# Patient Record
Sex: Female | Born: 1965 | Race: White | Hispanic: No | Marital: Married | Smoking: Never smoker
Health system: Southern US, Community
[De-identification: ages and names within clinical notes are randomized; demographics above are authoritative.]

## PROBLEM LIST (undated history)

## (undated) HISTORY — PX: FRACTURE SURGERY: SHX138

---

## 2004-09-23 ENCOUNTER — Ambulatory Visit: Payer: Self-pay | Admitting: Pediatrics

## 2016-10-23 DIAGNOSIS — M179 Osteoarthritis of knee, unspecified: Secondary | ICD-10-CM | POA: Insufficient documentation

## 2020-03-15 ENCOUNTER — Ambulatory Visit
Admission: EM | Admit: 2020-03-15 | Discharge: 2020-03-15 | Disposition: A | Discharge status: Home / Self Care | Payer: BC Managed Care – PPO | Attending: Family Medicine | Admitting: Family Medicine

## 2020-03-15 ENCOUNTER — Other Ambulatory Visit: Payer: Self-pay

## 2020-03-15 ENCOUNTER — Encounter: Payer: Self-pay | Admitting: Emergency Medicine

## 2020-03-15 DIAGNOSIS — R350 Frequency of micturition: Secondary | ICD-10-CM | POA: Diagnosis present

## 2020-03-15 DIAGNOSIS — R300 Dysuria: Secondary | ICD-10-CM | POA: Diagnosis present

## 2020-03-15 LAB — URINALYSIS, COMPLETE (UACMP) WITH MICROSCOPIC
Bilirubin Urine: NEGATIVE
Glucose, UA: NEGATIVE mg/dL
Ketones, ur: NEGATIVE mg/dL
Nitrite: NEGATIVE
Protein, ur: NEGATIVE mg/dL
Specific Gravity, Urine: >1.03 — ABNORMAL HIGH (ref 1.005–1.030)
pH: 5 (ref 5.0–8.0)

## 2020-03-15 MED ORDER — PHENAZOPYRIDINE HCL 200 MG PO TABS
200.0000 mg | ORAL_TABLET | Freq: Three times a day (TID) | ORAL | 0 refills | Status: DC
Start: 2020-03-15 — End: 2022-06-30

## 2020-03-15 MED ORDER — NITROFURANTOIN MONOHYD MACRO 100 MG PO CAPS
100.0000 mg | ORAL_CAPSULE | Freq: Two times a day (BID) | ORAL | 0 refills | Status: DC
Start: 2020-03-15 — End: 2022-06-30

## 2020-03-15 NOTE — ED Triage Notes (Signed)
Patient in today c/o urinary frequency and dysuria since yesterday. Patient denies fever. Patient has not used any OTC medications.

## 2020-03-15 NOTE — ED Provider Notes (Signed)
[facility]   CC: UTI  SUBJECTIVE:  Linda Lowery is a 54 y.o. female who complains of urinary frequency, urgency and dysuria for the 2 days.  Reports that she has not been drinking very much water.  Reports that she can go all day without urinating.  Reports that she has had a UTI in the past, but that it has been years and years.  Reports that there is lower abdominal pain.,  Denies flank pain.  Has not taken OTC medications for this.  Denies fever, chills, nausea, vomiting, abdominal pain, flank pain, abnormal vaginal discharge or bleeding, hematuria.    LMP: No LMP recorded. Patient is postmenopausal.  ROS: As in HPI.  All other pertinent ROS negative.     History reviewed. No pertinent past medical history. Past Surgical History:  Procedure Laterality Date  . CESAREAN SECTION    . FRACTURE SURGERY Left    knee   No Known Allergies No current facility-administered medications on file prior to encounter.   Current Outpatient Medications on File Prior to Encounter  Medication Sig Dispense Refill  . FYAVOLV 1-5 MG-MCG TABS tablet Take 1 tablet by mouth daily.    . meloxicam (MOBIC) 15 MG tablet prn     Social History   Socioeconomic History  . Marital status: Married    Spouse name: Not on file  . Number of children: Not on file  . Years of education: Not on file  . Highest education level: Not on file  Occupational History  . Not on file  Tobacco Use  . Smoking status: Never Smoker  . Smokeless tobacco: Never Used  Vaping Use  . Vaping Use: Never used  Substance and Sexual Activity  . Alcohol use: Yes    Comment: occassional  . Drug use: Never  . Sexual activity: Not on file  Other Topics Concern  . Not on file  Social History Narrative  . Not on file   Social Determinants of Health   Financial Resource Strain:   . Difficulty of Paying Living Expenses:   Food Insecurity:   . Worried About Programme researcher, broadcasting/film/video in the Last Year:   . Engineer, site in the Last Year:   Transportation Needs:   . Freight forwarder (Medical):   Marland Kitchen Lack of Transportation (Non-Medical):   Physical Activity:   . Days of Exercise per Week:   . Minutes of Exercise per Session:   Stress:   . Feeling of Stress :   Social Connections:   . Frequency of Communication with Friends and Family:   . Frequency of Social Gatherings with Friends and Family:   . Attends Religious Services:   . Active Member of Clubs or Organizations:   . Attends Banker Meetings:   Marland Kitchen Marital Status:   Intimate Partner Violence:   . Fear of Current or Ex-Partner:   . Emotionally Abused:   Marland Kitchen Physically Abused:   . Sexually Abused:    Family History  Problem Relation Age of Onset  . Other Mother        gun shot - homicide  . Dementia Father   . COPD Father     OBJECTIVE:  Vitals:   03/15/20 0835 03/15/20 0836  BP: 119/73   Pulse: 91   Resp: 18   Temp: 98.4 F (36.9 C)   TempSrc: Oral   SpO2: 98%   Weight:  153 lb (69.4 kg)  Height:  5\' 2"  (  1.575 m)   General appearance: AOx3 in no acute distress HEENT: NCAT.  Oropharynx clear.  Lungs: clear to auscultation bilaterally without adventitious breath sounds Heart: regular rate and rhythm.  Radial pulses 2+ symmetrical bilaterally Abdomen: soft; non-distended; suprapubic tenderness; bowel sounds present; no guarding or rebound tenderness Back: no CVA tenderness Extremities: no edema; symmetrical with no gross deformities Skin: warm and dry Neurologic: Ambulates from chair to exam table without difficulty Psychological: alert and cooperative; normal mood and affect  Labs Reviewed  URINALYSIS, COMPLETE (UACMP) WITH MICROSCOPIC - Abnormal; Notable for the following components:      Result Value   APPearance HAZY (*)    Specific Gravity, Urine >1.030 (*)    Hgb urine dipstick MODERATE (*)    Leukocytes,Ua SMALL (*)    Bacteria, UA FEW (*)    All other components within normal limits  URINE  CULTURE    ASSESSMENT & PLAN:  1. Dysuria   2. Urinary frequency     Meds ordered this encounter  Medications  . nitrofurantoin, macrocrystal-monohydrate, (MACROBID) 100 MG capsule    Sig: Take 1 capsule (100 mg total) by mouth 2 (two) times daily.    Dispense:  10 capsule    Refill:  0    Order Specific Question:   Supervising Provider    Answer:   Merrilee Jansky [id number]  . phenazopyridine (PYRIDIUM) 200 MG tablet    Sig: Take 1 tablet (200 mg total) by mouth 3 (three) times daily.    Dispense:  6 tablet    Refill:  0    Order Specific Question:   Supervising Provider    Answer:   Merrilee Jansky [id number]    Urine culture sent.  We will call you with abnormal results that need further treatment.   Push fluids and get plenty of rest.   Take antibiotic as directed and to completion Take pyridium as prescribed and as needed for symptomatic relief Follow up with PCP if symptoms persists Return here or go to ER if you have any new or worsening symptoms such as fever, worsening abdominal pain, nausea/vomiting, flank pain  Outlined signs and symptoms indicating need for more acute intervention. Patient verbalized understanding. After Visit Summary given.      Moshe Cipro, NP 03/15/20 [phone]

## 2020-03-15 NOTE — Discharge Instructions (Addendum)
You may have a urinary tract infection. We are going to culture your urine and will call you as soon as we have the results.   I have sent Macrobid to your pharmacy for you to take twice a day for 5 days  Drink plenty of water, 8-10 glasses per day.   You may take the pyridium for painful urination.  Follow up with your primary care provider as needed.   Go to the Emergency Department if you experience severe pain, shortness of breath, high fever, or other concerns.

## 2020-03-17 LAB — URINE CULTURE: Culture: 40000 — AB

## 2021-01-21 ENCOUNTER — Ambulatory Visit
Admission: EM | Admit: 2021-01-21 | Discharge: 2021-01-21 | Disposition: A | Discharge status: Home / Self Care | Payer: BC Managed Care – PPO

## 2021-01-21 ENCOUNTER — Ambulatory Visit: Payer: Self-pay

## 2021-01-21 ENCOUNTER — Other Ambulatory Visit: Payer: Self-pay

## 2021-01-21 ENCOUNTER — Ambulatory Visit (INDEPENDENT_AMBULATORY_CARE_PROVIDER_SITE_OTHER): Payer: BC Managed Care – PPO

## 2021-01-21 DIAGNOSIS — W19XXXA Unspecified fall, initial encounter: Secondary | ICD-10-CM

## 2021-01-21 DIAGNOSIS — R0781 Pleurodynia: Secondary | ICD-10-CM

## 2021-01-21 DIAGNOSIS — M546 Pain in thoracic spine: Secondary | ICD-10-CM

## 2021-01-21 MED ORDER — KETOROLAC TROMETHAMINE 10 MG PO TABS: 10.0000 mg | ORAL_TABLET | Freq: Four times a day (QID) | ORAL | 0 refills | Status: AC | PRN

## 2021-01-21 NOTE — ED Triage Notes (Signed)
Pt fell yesterday off a ladder and is c/o back pain on the right side through rib cage area.

## 2021-01-21 NOTE — ED Provider Notes (Signed)
[facility]    CSN: [id number] Arrival date & time: 01/21/21  6962      History   Chief Complaint Chief Complaint  Patient presents with  . Back Pain    HPI Linda Lowery is a 55 y.o. female presenting for right mid to lower back pain following a fall from a ladder yesterday.  Patient states that she fell about 2 to 3 feet to the ground.  She landed on her right side.  She has some pain in her right ribs.  No increased pain with breathing but does have increased pain with twisting and moving.  Has taken Aleve for the pain which does seem to help a little.  Also wearing a sports bra.  Patient states that a little bit of compression helps to.  Has tried heat as well.  Patient denies any other injuries.  She says she did not hit her head or lose consciousness.  She is denying any radiation of the back pain to her lower extremities and has not had any numbness, weakness or tingling.  Denies any shortness of breath or pain in her chest.  Patient says the fall was accidental and denies any inciting symptoms.  No other concerns.  HPI  History reviewed. No pertinent past medical history.  There are no problems to display for this patient.   Past Surgical History:  Procedure Laterality Date  . CESAREAN SECTION    . FRACTURE SURGERY Left    knee    OB History   No obstetric history on file.      Home Medications    Prior to Admission medications   Medication Sig Start Date End Date Taking? Authorizing Provider  estradiol (ESTRACE) 1 MG tablet Take 1 mg by mouth daily. 11/20/20  Yes [provider]  FYAVOLV 1-5 MG-MCG TABS tablet Take 1 tablet by mouth daily. 12/18/19  Yes [provider]  ketorolac (TORADOL) 10 MG tablet Take 1 tablet (10 mg total) by mouth every 6 (six) hours as needed for up to 5 days. 01/21/21 01/26/21 Yes Shirlee Latch, PA-C  norethindrone (MICRONOR) 0.35 MG tablet Take 1 tablet by mouth daily. 01/14/21  Yes [provider]   nitrofurantoin, macrocrystal-monohydrate, (MACROBID) 100 MG capsule Take 1 capsule (100 mg total) by mouth 2 (two) times daily. 03/15/20   Moshe Cipro, NP  phenazopyridine (PYRIDIUM) 200 MG tablet Take 1 tablet (200 mg total) by mouth 3 (three) times daily. 03/15/20   Moshe Cipro, NP    Family History Family History  Problem Relation Age of Onset  . Other Mother        gun shot - homicide  . Dementia Father   . COPD Father     Social History Social History   Tobacco Use  . Smoking status: Never Smoker  . Smokeless tobacco: Never Used  Vaping Use  . Vaping Use: Never used  Substance Use Topics  . Alcohol use: Yes    Comment: occassional  . Drug use: Never     Allergies   Patient has no known allergies.   Review of Systems Review of Systems  Respiratory: Negative for shortness of breath.   Cardiovascular: Negative for chest pain.  Gastrointestinal: Negative for abdominal pain.  Musculoskeletal: Positive for back pain. Negative for gait problem and joint swelling.  Skin: Negative for color change.  Neurological: Negative for dizziness, syncope, weakness, numbness and headaches.     Physical Exam Triage Vital Signs ED Triage Vitals  Enc Vitals Group     BP 01/21/21 0954 (!) 112/55     Pulse Rate 01/21/21 0954 71     Resp 01/21/21 0954 18     Temp 01/21/21 0954 98.4 F (36.9 C)     Temp Source 01/21/21 0954 Oral     SpO2 01/21/21 0954 99 %     Weight 01/21/21 0954 142 lb (64.4 kg)     Height 01/21/21 0954 5\' 2"  (1.575 m)     Head Circumference --      Peak Flow --      Pain Score 01/21/21 0953 9     Pain Loc --      Pain Edu? --      Excl. in GC? --    No data found.  Updated Vital Signs BP (!) 112/55 (BP Location: Left Arm)   Pulse 71   Temp 98.4 F (36.9 C) (Oral)   Resp 18   Ht 5\' 2"  (1.575 m)   Wt 142 lb (64.4 kg)   SpO2 99%   BMI 25.97 kg/m       Physical Exam Vitals and nursing note reviewed.  Constitutional:       General: She is not in acute distress.    Appearance: Normal appearance. She is not ill-appearing or toxic-appearing.  HENT:     Head: Normocephalic and atraumatic.  Eyes:     General: No scleral icterus.       Right eye: No discharge.        Left eye: No discharge.     Conjunctiva/sclera: Conjunctivae normal.     Pupils: Pupils are equal, round, and reactive to light.  Cardiovascular:     Rate and Rhythm: Normal rate and regular rhythm.     Heart sounds: Normal heart sounds.  Pulmonary:     Effort: Pulmonary effort is normal. No respiratory distress.     Breath sounds: Normal breath sounds.  Musculoskeletal:     Cervical back: Neck supple.     Thoracic back: Tenderness (TTP along right posterior lower ribs) present. No swelling.     Lumbar back: Tenderness (mild TTP right paralumbar muscles) present. No bony tenderness. Decreased range of motion.  Skin:    General: Skin is dry.  Neurological:     General: No focal deficit present.     Mental Status: She is alert and oriented to person, place, and time. Mental status is at baseline.     Cranial Nerves: No cranial nerve deficit.     Motor: No weakness.     Gait: Gait normal.  Psychiatric:        Mood and Affect: Mood normal.        Behavior: Behavior normal.        Thought Content: Thought content normal.      UC Treatments / Results  Labs (all labs ordered are listed, but only abnormal results are displayed) Labs Reviewed - No data to display  EKG   Radiology DG Ribs Unilateral W/Chest Right  Result Date: 01/21/2021 CLINICAL DATA:  Fall with right rib pain. EXAM: RIGHT RIBS AND CHEST - 3+ VIEW COMPARISON:  None. FINDINGS: No fracture or other bone lesions are seen involving the ribs. Mild degenerative changes are seen in both acromioclavicular joints. There is no evidence of pneumothorax or pleural effusion. Both lungs are clear. Heart size and mediastinal contours are within normal limits. IMPRESSION: No acute osseous  injury.  Clear lungs. Electronically Signed   By:  Litton M.D.   On: 01/21/2021 11:06    Procedures Procedures (including critical care time)  Medications Ordered in UC Medications - No data to display  Initial Impression / Assessment and Plan / UC Course  I have reviewed the triage vital signs and the nursing notes.  Pertinent labs & imaging results that were available during my care of the patient were reviewed by me and considered in my medical decision making (see chart for details).   55 year old female presenting for right sided rib and lower back pain following an accidental fall.  X-ray of right ribs obtained today to assess for possible rib fracture.  X-rays independently reviewed by me.  No fracture seen.  Overread by radiologist shows no acute abnormality/osseous injury.  Reviewed result with patient.  Advised this could be rib contusion and soft tissue/musculoskeletal pain.  Advised supportive care at this time.  Offered Toradol injection but patient declined stating she does not like needles.  I have sent this medication to the pharmacy.  Advised to take this or another NSAID but not both.  Advised also Tylenol and ice/heat.  Advised to follow-up with PCP if not improving in the next 7 to 10 days and ED precautions for back pain reviewed.   Final Clinical Impressions(s) / UC Diagnoses   Final diagnoses:  Acute right-sided thoracic back pain  Fall, initial encounter     Discharge Instructions     BACK PAIN: Normal x-ray. Stressed avoiding painful activities . Reviewed RICE guidelines. Use medications as directed, including NSAIDs. If no NSAIDs have been prescribed for you today, you may take Aleve or Motrin over the counter. May use Tylenol in between doses of NSAIDs.  If no improvement in the next 1-2 weeks, f/u with PCP or return to our office for reexamination, and please feel free to call or return at any time for any questions or concerns you may have and we will  be happy to help you!        ED Prescriptions    Medication Sig Dispense Auth. Provider   ketorolac (TORADOL) 10 MG tablet Take 1 tablet (10 mg total) by mouth every 6 (six) hours as needed for up to 5 days. 20 tablet Gareth Morgan     PDMP not reviewed this encounter.   Shirlee Latch, PA-C 01/21/21 1136

## 2021-01-21 NOTE — Discharge Instructions (Signed)
BACK PAIN: Normal x-ray. Stressed avoiding painful activities . Reviewed RICE guidelines. Use medications as directed, including NSAIDs. If no NSAIDs have been prescribed for you today, you may take Aleve or Motrin over the counter. May use Tylenol in between doses of NSAIDs.  If no improvement in the next 1-2 weeks, f/u with PCP or return to our office for reexamination, and please feel free to call or return at any time for any questions or concerns you may have and we will be happy to help you!

## 2022-06-29 ENCOUNTER — Ambulatory Visit: Payer: BC Managed Care – PPO | Admitting: Internal Medicine

## 2022-06-29 NOTE — Progress Notes (Signed)
Sleep Medicine   Office Visit  Patient Name: Linda Lowery DOB: [birth year] MRN [id number]    Chief Complaint: Sleep consult  Brief History:  Linda Lowery presents for an initial consult for sleep evaluation and to establish care. In 2020 she had a screener HST at her dentist office and was positive for OSA. No treatment has been started for her. She reports her sleep quality is fair due to feeling tired when she wakes in the mornings. This is noted most nights. The patient's bed partner reports  loud snoring and witnessed apnea at night. The patient relates the following symptoms: snoring and gasps that wake her, waking tired, brain fog, lack of focus, excessively sleepy with sleepy driving are also present. Low energy is also present. The patient goes to sleep at 11:00pm and wakes up at 6:00am and takes an hour to get out of bed. Sleep quality is the same when outside home environment.  Patient has noted no restlessness of her legs at night that would disrupt her sleep.  The patient  relates no unusual behavior during the night.  The patient relates no history of psychiatric problems. The Epworth Sleepiness Score is 12 out of 24 .  The patient relates  Cardiovascular risk factors include: none. The patient does note she may want to follow up with her dentist for oral appliance rather than cpap if an option based on her study, but is open to options.   ROS  General: (-) fever, (-) chills, (-) night sweat Nose and Sinuses: (-) nasal stuffiness or itchiness, (-) postnasal drip, (-) nosebleeds, (-) sinus trouble. Mouth and Throat: (-) sore throat, (-) hoarseness. Neck: (-) swollen glands, (-) enlarged thyroid, (-) neck pain. Respiratory: - cough, - shortness of breath, - wheezing. Neurologic: - numbness, - tingling. Psychiatric: - anxiety, - depression Sleep behavior: -sleep paralysis -hypnogogic hallucinations -dream enactment      -vivid dreams -cataplexy -night terrors -sleep walking   Current  Medication: Outpatient Encounter Medications as of 06/30/2022  Medication Sig   estradiol (VIVELLE-DOT) 0.0375 MG/24HR Place onto the skin.   progesterone (PROMETRIUM) 100 MG capsule Take 100 mg by mouth at bedtime.   [DISCONTINUED] estradiol (ESTRACE) 1 MG tablet Take 1 mg by mouth daily.   [DISCONTINUED] FYAVOLV 1-5 MG-MCG TABS tablet Take 1 tablet by mouth daily.   [DISCONTINUED] nitrofurantoin, macrocrystal-monohydrate, (MACROBID) 100 MG capsule Take 1 capsule (100 mg total) by mouth 2 (two) times daily.   [DISCONTINUED] norethindrone (MICRONOR) 0.35 MG tablet Take 1 tablet by mouth daily.   [DISCONTINUED] phenazopyridine (PYRIDIUM) 200 MG tablet Take 1 tablet (200 mg total) by mouth 3 (three) times daily.   No facility-administered encounter medications on file as of 06/30/2022.    Surgical History: Past Surgical History:  Procedure Laterality Date   CESAREAN SECTION     FRACTURE SURGERY Left    knee    Medical History: History reviewed. No pertinent past medical history.  Family History: Non contributory to the present illness  Social History: Social History   Socioeconomic History   Marital status: Married    Spouse name: Not on file   Number of children: Not on file   Years of education: Not on file   Highest education level: Not on file  Occupational History   Not on file  Tobacco Use   Smoking status: Never   Smokeless tobacco: Never  Vaping Use   Vaping Use: Never used  Substance and Sexual Activity   Alcohol use: Yes  Comment: occassional   Drug use: Never   Sexual activity: Not on file  Other Topics Concern   Not on file  Social History Narrative   Not on file   Social Determinants of Health   Financial Resource Strain: Not on file  Food Insecurity: Not on file  Transportation Needs: Not on file  Physical Activity: Not on file  Stress: Not on file  Social Connections: Not on file  Intimate Partner Violence: Not on file    Vital  Signs: Blood pressure 109/75, pulse 68, resp. rate 12, height 5\' 2"  (1.575 m), weight 146 lb 6.4 oz (66.4 kg), SpO2 97 %. Body mass index is 26.78 kg/m.   Examination: General Appearance: The patient is well-developed, well-nourished, and in no distress. Neck Circumference: 34cm Skin: Gross inspection of skin unremarkable. Head: normocephalic, no gross deformities. Eyes: no gross deformities noted. ENT: ears appear grossly normal Neurologic: Alert and oriented. No involuntary movements.    STOP BANG RISK ASSESSMENT S (snore) Have you been told that you snore?     YES   T (tired) Are you often tired, fatigued, or sleepy during the day?   YES  O (obstruction) Do you stop breathing, choke, or gasp during sleep? YES   P (pressure) Do you have or are you being treated for high blood pressure? NO   B (BMI) Is your body index greater than 35 kg/m? NO   A (age) Are you 67 years old or older? YES   N (neck) Do you have a neck circumference greater than 16 inches?   NO   G (gender) Are you a female? NO   TOTAL STOP/BANG "YES" ANSWERS 4                                                               A STOP-Bang score of 2 or less is considered low risk, and a score of 5 or more is high risk for having either moderate or severe OSA. For people who score 3 or 4, doctors may need to perform further assessment to determine how likely they are to have OSA.         EPWORTH SLEEPINESS SCALE:  Scale:  (0)= no chance of dozing; (1)= slight chance of dozing; (2)= moderate chance of dozing; (3)= high chance of dozing  Chance  Situtation    Sitting and reading: 1    Watching TV: 3    Sitting Inactive in public: 1    As a passenger in car: 3      Lying down to rest: 2    Sitting and talking: 0    Sitting quielty after lunch: 0    In a car, stopped in traffic: 2   TOTAL SCORE:   12 out of 24    SLEEP STUDIES:  Not available   LABS: No results found for this or any  previous visit (from the past 2160 hour(s)).  Radiology: DG Ribs Unilateral W/Chest Right  Result Date: 01/21/2021 CLINICAL DATA:  Fall with right rib pain. EXAM: RIGHT RIBS AND CHEST - 3+ VIEW COMPARISON:  None. FINDINGS: No fracture or other bone lesions are seen involving the ribs. Mild degenerative changes are seen in both acromioclavicular joints. There is no evidence of pneumothorax or pleural effusion.  Both lungs are clear. Heart size and mediastinal contours are within normal limits. IMPRESSION: No acute osseous injury.  Clear lungs. Electronically Signed   By: Zerita Boers M.D.   On: 01/21/2021 11:06    No results found.  No results found.    Assessment and Plan: There are no problems to display for this patient.    PLAN OSA:   Patient evaluation suggests high risk of sleep disordered breathing due to positive Osa screening HST with dentist, snoring and gasps that wake her, witnessed apneas, waking tired, brain fog, lack of focus, and excessively sleepy with sleepy driving.   Suggest: PSG to assess/treat the patient's sleep disordered breathing. The patient was also counselled on wt loss to optimize sleep health.    1. Hypersomnia Will order PSG    General Counseling: I have discussed the findings of the evaluation and examination with Verdis Frederickson.  I have also discussed any further diagnostic evaluation thatmay be needed or ordered today. [place] verbalizes understanding of the findings of todays visit. We also reviewed her medications today and discussed drug interactions and side effects including but not limited excessive drowsiness and altered mental states. We also discussed that there is always a risk not just to her but also people around her. she has been encouraged to call the office with any questions or concerns that should arise related to todays visit.  No orders of the defined types were placed in this encounter.       I have personally obtained a history,  evaluated the patient, evaluated pertinent data, formulated the assessment and plan and placed orders.  This patient was seen by Drema Dallas, PA-C in collaboration with Dr. Devona Konig as a part of collaborative care agreement.   Allyne Gee, MD [facility] Diplomate ABMS Pulmonary and Critical Care Medicine Sleep medicine

## 2022-06-29 NOTE — Progress Notes (Signed)
Pt presented 17 minutes late for her appointment and had to be rescheduled.

## 2022-06-30 ENCOUNTER — Ambulatory Visit (INDEPENDENT_AMBULATORY_CARE_PROVIDER_SITE_OTHER): Payer: BC Managed Care – PPO | Admitting: Internal Medicine

## 2022-06-30 VITALS — BP 109/75 | HR 68 | Resp 12 | Ht 62.0 in | Wt 146.4 lb

## 2022-06-30 DIAGNOSIS — G4710 Hypersomnia, unspecified: Secondary | ICD-10-CM | POA: Diagnosis not present

## 2022-08-02 IMAGING — CR DG RIBS W/ CHEST 3+V*R*
5 series · 5 of 5 positions shown · non-contrast
Comparison: None.

CLINICAL DATA: Fall with right rib pain.

EXAM:
RIGHT RIBS AND CHEST - 3+ VIEW

[chest pa]
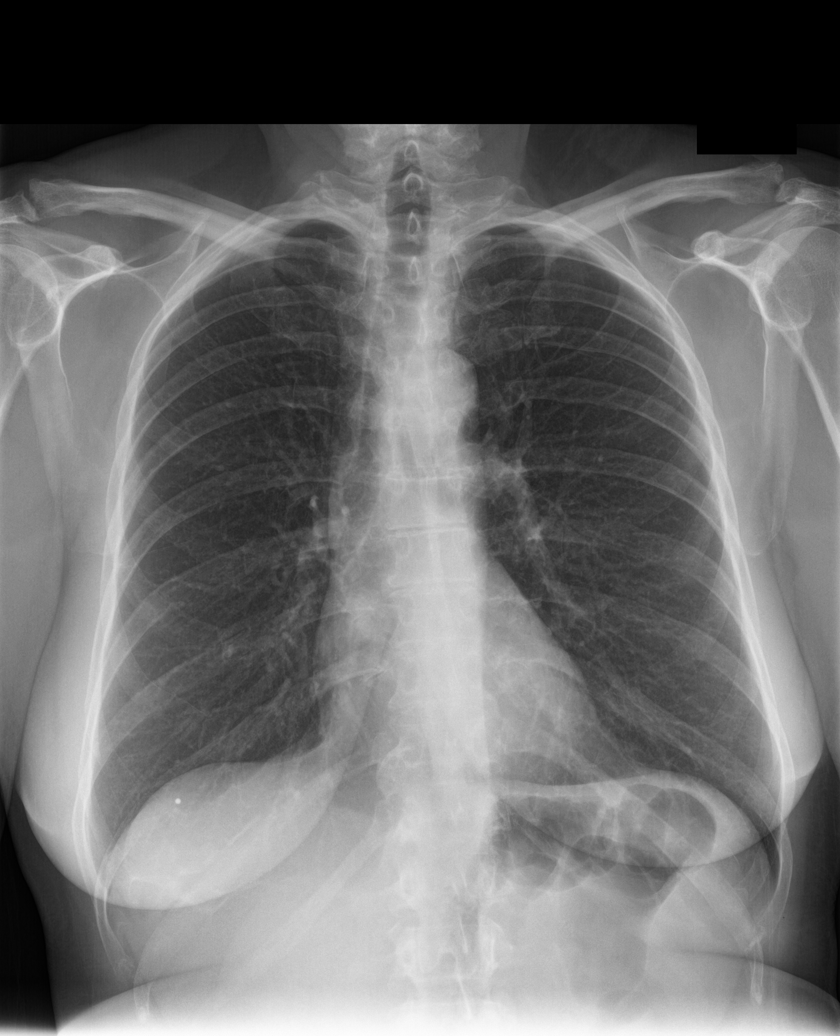

[rib pa (1 of 2)]
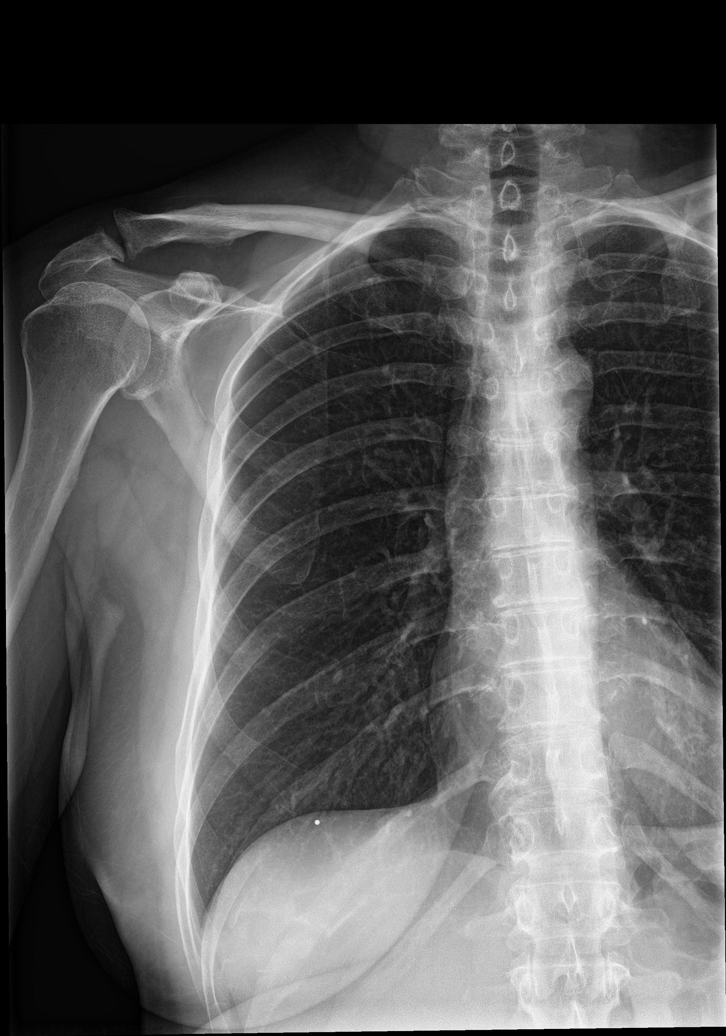

[rib pa (2 of 2)]
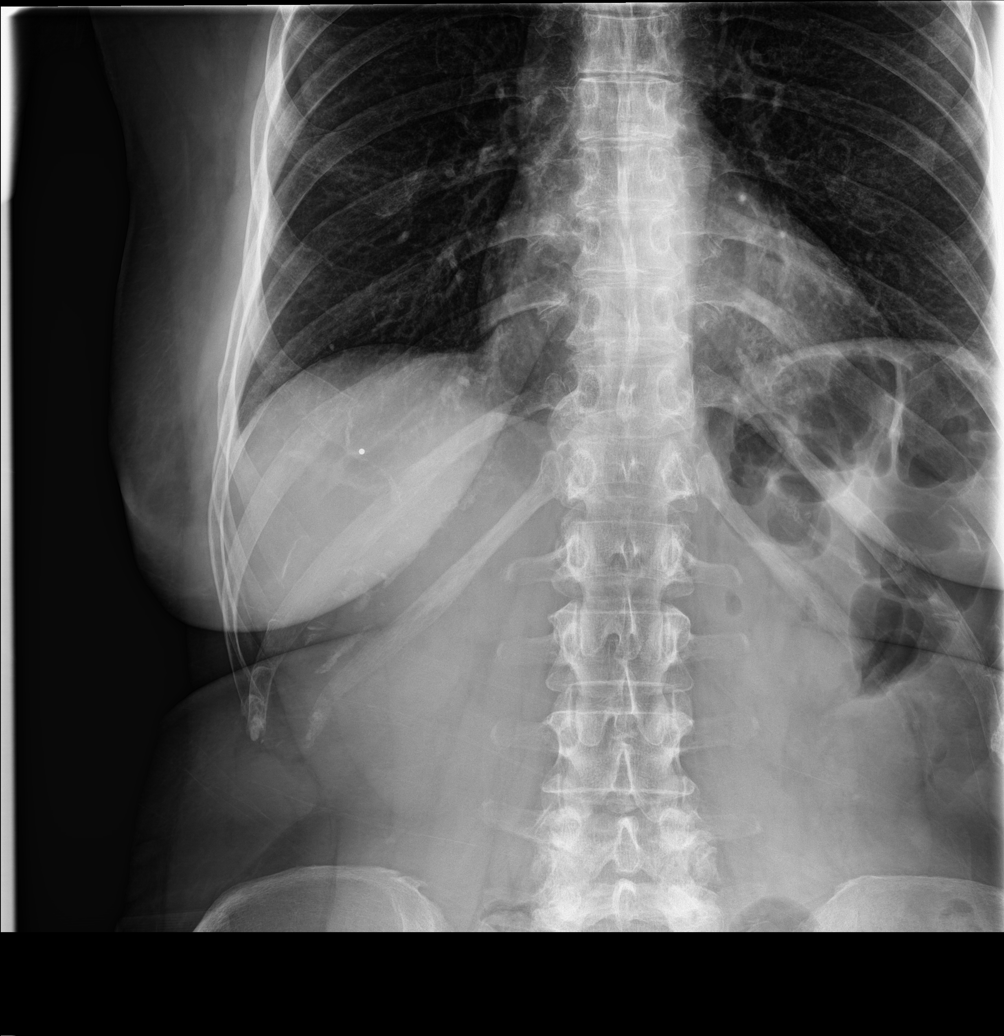

[rib obl (1 of 2)]
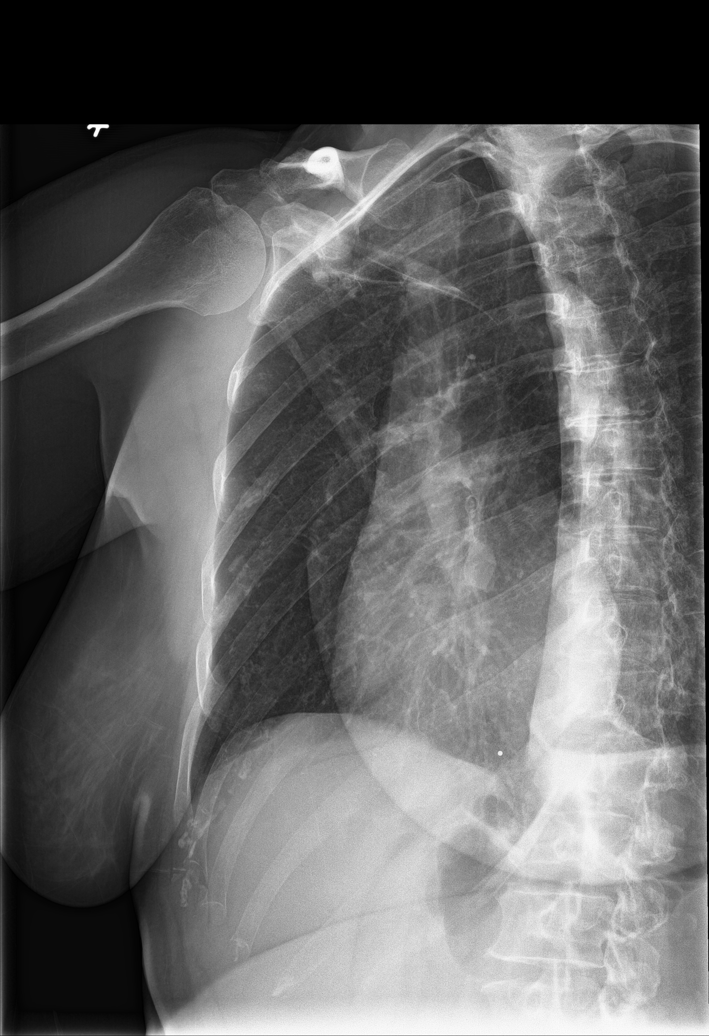

[rib obl (2 of 2)]
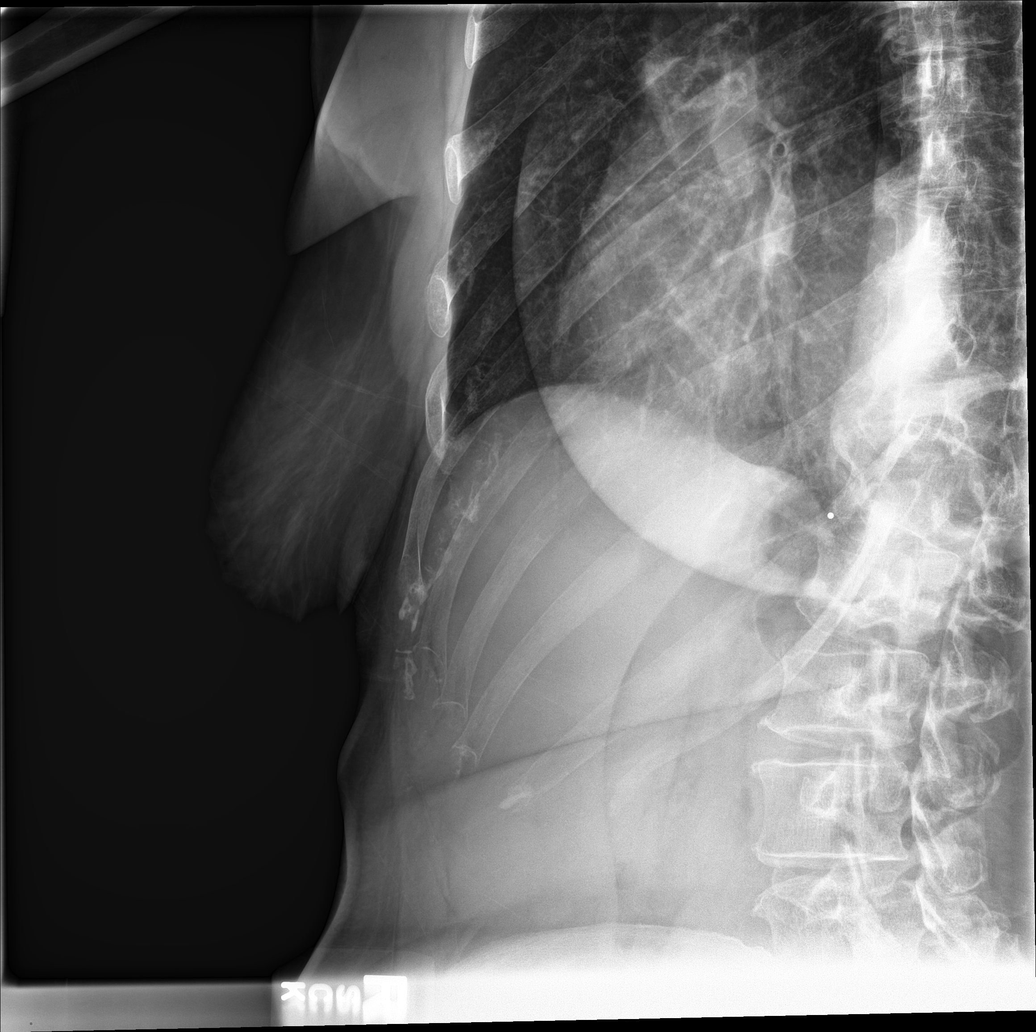

[5 of 5 positions shown; findings below may reference images not displayed]

FINDINGS: No fracture or other bone lesions are seen involving the ribs. Mild
degenerative changes are seen in both acromioclavicular joints.
There is no evidence of pneumothorax or pleural effusion. Both lungs
are clear. Heart size and mediastinal contours are within normal
limits.
IMPRESSION: No acute osseous injury.  Clear lungs.

## 2023-01-25 ENCOUNTER — Ambulatory Visit (INDEPENDENT_AMBULATORY_CARE_PROVIDER_SITE_OTHER): Payer: BC Managed Care – PPO | Admitting: Internal Medicine

## 2023-01-25 VITALS — BP 112/72 | HR 75 | Resp 12 | Ht 62.0 in | Wt 148.0 lb

## 2023-01-25 DIAGNOSIS — G4733 Obstructive sleep apnea (adult) (pediatric): Secondary | ICD-10-CM | POA: Diagnosis not present

## 2023-01-25 DIAGNOSIS — R519 Headache, unspecified: Secondary | ICD-10-CM | POA: Insufficient documentation

## 2023-01-25 DIAGNOSIS — H9190 Unspecified hearing loss, unspecified ear: Secondary | ICD-10-CM | POA: Insufficient documentation

## 2023-01-25 DIAGNOSIS — Z7189 Other specified counseling: Secondary | ICD-10-CM | POA: Diagnosis not present

## 2023-01-25 NOTE — Progress Notes (Unsigned)
[facility] [address] [place], [place] [address]  Pulmonary Sleep Medicine   Office Visit Note  Patient Name: Linda Lowery DOB: 05-07-1966 MRN [id number]    Chief Complaint: Obstructive Sleep Apnea visit  Brief History:  Tishawna is seen today for a 90 day follow up after set up on APAP 6-12cmh20. The patient has a 4 year  history of sleep apnea. Patient is not using PAP nightly.  The patient feels rested after sleeping with PAP.  The patient reports benefit from PAP use. Reported sleepiness is  improved and the Epworth Sleepiness Score is 7 out of 24. The patient does not take naps. The patient complains of the following: issues with the mask straps bothering her and mask leaking that wakes her.  The compliance download shows  34% compliance with an average use time of 3.5 hours. The AHI is 3.5  The patient does not complain of limb movements disrupting sleep. Patient has interruptions to her sleep with her pets that disrupts her sleep.  ROS  General: (-) fever, (-) chills, (-) night sweat Nose and Sinuses: (-) nasal stuffiness or itchiness, (-) postnasal drip, (-) nosebleeds, (-) sinus trouble. Mouth and Throat: (-) sore throat, (-) hoarseness. Neck: (-) swollen glands, (-) enlarged thyroid, (-) neck pain. Respiratory: - cough, - shortness of breath, - wheezing. Neurologic: - numbness, - tingling. Psychiatric: - anxiety, - depression   Current Medication: Outpatient Encounter Medications as of 01/25/2023  Medication Sig   Multiple Vitamins-Minerals (CENTRUM MULTIGUMMIES) CHEW Chew by mouth.   progesterone (PROMETRIUM) 200 MG capsule Take 200 mg by mouth at bedtime.   [DISCONTINUED] estradiol (VIVELLE-DOT) 0.0375 MG/24HR Place onto the skin.   [DISCONTINUED] progesterone (PROMETRIUM) 100 MG capsule Take 100 mg by mouth at bedtime.   No facility-administered encounter medications on file as of 01/25/2023.    Surgical History: Past Surgical History:  Procedure  Laterality Date   CESAREAN SECTION     FRACTURE SURGERY Left    knee    Medical History: No past medical history on file.  Family History: Non contributory to the present illness  Social History: Social History   Socioeconomic History   Marital status: Married    Spouse name: Not on file   Number of children: Not on file   Years of education: Not on file   Highest education level: Not on file  Occupational History   Not on file  Tobacco Use   Smoking status: Never   Smokeless tobacco: Never  Vaping Use   Vaping Use: Never used  Substance and Sexual Activity   Alcohol use: Yes    Comment: occassional   Drug use: Never   Sexual activity: Not on file  Other Topics Concern   Not on file  Social History Narrative   Not on file   Social Determinants of Health   Financial Resource Strain: Not on file  Food Insecurity: Not on file  Transportation Needs: Not on file  Physical Activity: Not on file  Stress: Not on file  Social Connections: Not on file  Intimate Partner Violence: Not on file    Vital Signs: Blood pressure 112/72, pulse 75, resp. rate 12, height 5\' 2"  (1.575 m), weight 148 lb (67.1 kg), SpO2 100 %. Body mass index is 27.07 kg/m.    Examination: General Appearance: The patient is well-developed, well-nourished, and in no distress. Neck Circumference: 34cm Skin: Gross inspection of skin unremarkable. Head: normocephalic, no gross deformities. Eyes: no gross deformities noted. ENT:  ears appear grossly normal Neurologic: Alert and oriented. No involuntary movements.  STOP BANG RISK ASSESSMENT S (snore) Have you been told that you snore?     No   T (tired) Are you often tired, fatigued, or sleepy during the day?   NO  O (obstruction) Do you stop breathing, choke, or gasp during sleep? NO   P (pressure) Do you have or are you being treated for high blood pressure? YES   B (BMI) Is your body index greater than 35 kg/m? NO   A (age) Are you 59  years old or older? YES   N (neck) Do you have a neck circumference greater than 16 inches?   NO   G (gender) Are you a female? NO   TOTAL STOP/BANG "YES" ANSWERS 2       A STOP-Bang score of 2 or less is considered low risk, and a score of 5 or more is high risk for having either moderate or severe OSA. For people who score 3 or 4, doctors may need to perform further assessment to determine how likely they are to have OSA.         EPWORTH SLEEPINESS SCALE:  Scale:  (0)= no chance of dozing; (1)= slight chance of dozing; (2)= moderate chance of dozing; (3)= high chance of dozing  Chance  Situtation    Sitting and reading: 2    Watching TV: 1    Sitting Inactive in public: 2    As a passenger in car: 2      Lying down to rest: 0    Sitting and talking: 0    Sitting quielty after lunch: 0    In a car, stopped in traffic: 0   TOTAL SCORE:   7 out of 24    SLEEP STUDIES:  HST (08/19/22)  RDI 8.3, min SPO2 86% HST (11/23/18) RDI 29.6, min SPO2 83%    CPAP COMPLIANCE DATA:  Date Range: 10/20/22-01/20/23  Average Daily Use: 3.5 hours  Median Use: 3.5 hrs  Compliance for > 4 Hours: 345 days  AHI: 3.5 respiratory events per hour  Days Used: 80/93  Mask Leak: 4.6  95th Percentile Pressure: 12.2         LABS: No results found for this or any previous visit (from the past 2160 hour(s)).  Radiology: DG Ribs Unilateral W/Chest Right  Result Date: 01/21/2021 CLINICAL DATA:  Fall with right rib pain. EXAM: RIGHT RIBS AND CHEST - 3+ VIEW COMPARISON:  None. FINDINGS: No fracture or other bone lesions are seen involving the ribs. Mild degenerative changes are seen in both acromioclavicular joints. There is no evidence of pneumothorax or pleural effusion. Both lungs are clear. Heart size and mediastinal contours are within normal limits. IMPRESSION: No acute osseous injury.  Clear lungs. Electronically Signed   By: Romona Curls M.D.   On: 01/21/2021 11:06    No  results found.  No results found.    Assessment and Plan: Patient Active Problem List   Diagnosis Date Noted   OSA on CPAP 01/25/2023   CPAP use counseling 01/25/2023   Headache disorder 01/25/2023   Hearing disorder 01/25/2023   Osteoarthritis of knee 10/23/2016    1. OSA on CPAP The patient does tolerate PAP and reports  benefit from PAP use. The patient was reminded how to clean equipment and advised to replace supplies routinely. The patient was also counselled on weight loss. The compliance is poor. The AHI is 3.4.   OSA  on cpap- controlled. Increase  compliance with pap. CPAP continues to be medically necessary to treat this patient's OSA. RTC 46m.   2. CPAP use counseling CPAP Counseling: had a lengthy discussion with the patient regarding the importance of PAP therapy in management of the sleep apnea. Patient appears to understand the risk factor reduction and also understands the risks associated with untreated sleep apnea. Patient will try to make a good faith effort to remain compliant with therapy. Also instructed the patient on proper cleaning of the device including the water must be changed daily if possible and use of distilled water is preferred. Patient understands that the machine should be regularly cleaned with appropriate recommended cleaning solutions that do not damage the PAP machine for example given white vinegar and water rinses. Other methods such as ozone treatment may not be as good as these simple methods to achieve cleaning.     General Counseling: I have discussed the findings of the evaluation and examination with Byrd Hesselbach.  I have also discussed any further diagnostic evaluation thatmay be needed or ordered today. Chrislynn verbalizes understanding of the findings of todays visit. We also reviewed her medications today and discussed drug interactions and side effects including but not limited excessive drowsiness and altered mental states. We also discussed that  there is always a risk not just to her but also people around her. she has been encouraged to call the office with any questions or concerns that should arise related to todays visit.  No orders of the defined types were placed in this encounter.       I have personally obtained a history, examined the patient, evaluated laboratory and imaging results, formulated the assessment and plan and placed orders. This patient was seen today by Emmaline Kluver, PA-C in collaboration with Dr. Freda Munro.   Yevonne Pax, MD [facility] Diplomate ABMS Pulmonary Critical Care Medicine and Sleep Medicine

## 2023-01-25 NOTE — Patient Instructions (Signed)

## 2023-05-31 ENCOUNTER — Ambulatory Visit (INDEPENDENT_AMBULATORY_CARE_PROVIDER_SITE_OTHER): Payer: BC Managed Care – PPO | Admitting: Internal Medicine

## 2023-05-31 VITALS — BP 121/83 | HR 76 | Resp 12 | Ht 62.0 in | Wt 148.0 lb

## 2023-05-31 DIAGNOSIS — Z7189 Other specified counseling: Secondary | ICD-10-CM | POA: Diagnosis not present

## 2023-05-31 DIAGNOSIS — G4733 Obstructive sleep apnea (adult) (pediatric): Secondary | ICD-10-CM | POA: Diagnosis not present

## 2023-05-31 NOTE — Patient Instructions (Signed)
Living With Sleep Apnea Sleep apnea is a condition in which breathing pauses or becomes shallow during sleep. Sleep apnea is most commonly caused by a collapsed or blocked airway. People with sleep apnea usually snore loudly. They may have times when they gasp and stop breathing for 10 seconds or more during sleep. This may happen many times during the night. The breaks in breathing also interrupt the deep sleep that you need to feel rested. Even if you do not completely wake up from the gaps in breathing, your sleep may not be restful and you feel tired during the day. You may also have a headache in the morning and low energy during the day, and you may feel anxious or depressed. How can sleep apnea affect me? Sleep apnea increases your chances of extreme tiredness during the day (daytime fatigue). It can also increase your risk for health conditions, such as: Heart attack. Stroke. Obesity. Type 2 diabetes. Heart failure. Irregular heartbeat. High blood pressure. If you have daytime fatigue as a result of sleep apnea, you may be more likely to: Perform poorly at school or work. Fall asleep while driving. Have difficulty with attention. Develop depression or anxiety. Have sexual dysfunction. What actions can I take to manage sleep apnea? Sleep apnea treatment  If you were given a device to open your airway while you sleep, use it only as told by your health care provider. You may be given: An oral appliance. This is a custom-made mouthpiece that shifts your lower jaw forward. A continuous positive airway pressure (CPAP) device. This device blows air through a mask when you breathe out (exhale). A nasal expiratory positive airway pressure (EPAP) device. This device has valves that you put into each nostril. A bi-level positive airway pressure (BIPAP) device. This device blows air through a mask when you breathe in (inhale) and breathe out (exhale). You may need surgery if other treatments  do not work for you. Sleep habits Go to sleep and wake up at the same time every day. This helps set your internal clock (circadian rhythm) for sleeping. If you stay up later than usual, such as on weekends, try to get up in the morning within 2 hours of your normal wake time. Try to get at least 7-9 hours of sleep each night. Stop using a computer, tablet, and mobile phone a few hours before bedtime. Do not take long naps during the day. If you nap, limit it to 30 minutes. Have a relaxing bedtime routine. Reading or listening to music may relax you and help you sleep. Use your bedroom only for sleep. Keep your television and computer out of your bedroom. Keep your bedroom cool, dark, and quiet. Use a supportive mattress and pillows. Follow your health care provider's instructions for other changes to sleep habits. Nutrition Do not eat heavy meals in the evening. Do not have caffeine in the later part of the day. The effects of caffeine can last for more than 5 hours. Follow your health care provider's or dietitian's instructions for any diet changes. Lifestyle     Do not drink alcohol before bedtime. Alcohol can cause you to fall asleep at first, but then it can cause you to wake up in the middle of the night and have trouble getting back to sleep. Do not use any products that contain nicotine or tobacco. These products include cigarettes, chewing tobacco, and vaping devices, such as e-cigarettes. If you need help quitting, ask your health care provider. Medicines Take   over-the-counter and prescription medicines only as told by your health care provider. Do not use over-the-counter sleep medicine. You can become dependent on this medicine, and it can make sleep apnea worse. Do not use medicines, such as sedatives and narcotics, unless told by your health care provider. Activity Exercise on most days, but avoid exercising in the evening. Exercising near bedtime can interfere with  sleeping. If possible, spend time outside every day. Natural light helps regulate your circadian rhythm. General information Lose weight if you need to, and maintain a healthy weight. Keep all follow-up visits. This is important. If you are having surgery, make sure to tell your health care provider that you have sleep apnea. You may need to bring your device with you. Where to find more information Learn more about sleep apnea and daytime fatigue from: American Sleep Association: sleepassociation.org National Sleep Foundation: sleepfoundation.org National Heart, Lung, and Blood Institute: nhlbi.nih.gov Summary Sleep apnea is a condition in which breathing pauses or becomes shallow during sleep. Sleep apnea can cause daytime fatigue and other serious health conditions. You may need to wear a device while sleeping to help keep your airway open. If you are having surgery, make sure to tell your health care provider that you have sleep apnea. You may need to bring your device with you. Making changes to sleep habits, diet, lifestyle, and activity can help you manage sleep apnea. This information is not intended to replace advice given to you by your health care provider. Make sure you discuss any questions you have with your health care provider. Document Revised: 04/09/2021 Document Reviewed: 08/09/2020 Elsevier Patient Education  2023 Elsevier Inc.  

## 2023-05-31 NOTE — Progress Notes (Unsigned)
[facility] [address] [place], [place] [address]  Pulmonary Sleep Medicine   Office Visit Note  Patient Name: Linda Lowery DOB: 1966-06-16 MRN [id number]    Chief Complaint: Obstructive Sleep Apnea visit  Brief History:  Keishia is seen today for a 4 month follow up on APAP at 6-12 cmh20.  The patient has a 5 year history of sleep apnea. Patient is not using PAP nightly.  The patient feels not rested after sleeping with PAP.  The patient reports not benefiting from PAP use. Reported sleepiness is  improved and the Epworth Sleepiness Score is 8 out of 24. The patient does not take naps. The patient complains of the following: Her mask is leaking into her eyes and drying them out.  She is interested in trying an oral appliance and plans to reach out to her dentist.  The compliance download shows 28% compliance with an average use time of 3:03 hours. The AHI is 4.6.  The patient does not complain of limb movements disrupting sleep.  ROS  General: (-) fever, (-) chills, (-) night sweat Nose and Sinuses: (-) nasal stuffiness or itchiness, (-) postnasal drip, (-) nosebleeds, (-) sinus trouble. Mouth and Throat: (-) sore throat, (-) hoarseness. Neck: (-) swollen glands, (-) enlarged thyroid, (-) neck pain. Respiratory: - cough, - shortness of breath, - wheezing. Neurologic: - numbness, - tingling. Psychiatric: - anxiety, - depression   Current Medication: Outpatient Encounter Medications as of 05/31/2023  Medication Sig   Multiple Vitamins-Minerals (CENTRUM MULTIGUMMIES) CHEW Chew by mouth.   progesterone (PROMETRIUM) 200 MG capsule Take 200 mg by mouth at bedtime.   No facility-administered encounter medications on file as of 05/31/2023.    Surgical History: Past Surgical History:  Procedure Laterality Date   CESAREAN SECTION     FRACTURE SURGERY Left    knee    Medical History: No past medical history on file.  Family History: Non contributory to the  present illness  Social History: Social History   Socioeconomic History   Marital status: Married    Spouse name: Not on file   Number of children: Not on file   Years of education: Not on file   Highest education level: Not on file  Occupational History   Not on file  Tobacco Use   Smoking status: Never   Smokeless tobacco: Never  Vaping Use   Vaping status: Never Used  Substance and Sexual Activity   Alcohol use: Yes    Comment: occassional   Drug use: Never   Sexual activity: Not on file  Other Topics Concern   Not on file  Social History Narrative   Not on file   Social Determinants of Health   Financial Resource Strain: Not on file  Food Insecurity: Not on file  Transportation Needs: Not on file  Physical Activity: Not on file  Stress: Not on file  Social Connections: Not on file  Intimate Partner Violence: Not on file    Vital Signs: Blood pressure 121/83, pulse 76, resp. rate 12, height 5\' 2"  (1.575 m), weight 148 lb (67.1 kg), SpO2 98%. Body mass index is 27.07 kg/m.    Examination: General Appearance: The patient is well-developed, well-nourished, and in no distress. Neck Circumference: 34 cm Skin: Gross inspection of skin unremarkable. Head: normocephalic, no gross deformities. Eyes: no gross deformities noted. ENT: ears appear grossly normal Neurologic: Alert and oriented. No involuntary movements.  STOP BANG RISK ASSESSMENT S (snore) Have you been told that you  snore?     NO   T (tired) Are you often tired, fatigued, or sleepy during the day?   NO  O (obstruction) Do you stop breathing, choke, or gasp during sleep? NO   P (pressure) Do you have or are you being treated for high blood pressure? YES   B (BMI) Is your body index greater than 35 kg/m? NO   A (age) Are you 59 years old or older? YES   N (neck) Do you have a neck circumference greater than 16 inches?   NO   G (gender) Are you a female? NO   TOTAL STOP/BANG "YES" ANSWERS 2        A STOP-Bang score of 2 or less is considered low risk, and a score of 5 or more is high risk for having either moderate or severe OSA. For people who score 3 or 4, doctors may need to perform further assessment to determine how likely they are to have OSA.         EPWORTH SLEEPINESS SCALE:  Scale:  (0)= no chance of dozing; (1)= slight chance of dozing; (2)= moderate chance of dozing; (3)= high chance of dozing  Chance  Situtation    Sitting and reading: 1    Watching TV: 1    Sitting Inactive in public: 0    As a passenger in car: 2      Lying down to rest: 2    Sitting and talking: 0    Sitting quielty after lunch: 1    In a car, stopped in traffic: 1   TOTAL SCORE:   8 out of 24    SLEEP STUDIES:  HST (08/19/22) REI 8.3, MIN SPO2 86%  HST (11/23/18) REI 29.6, MIN SPO2 83%   CPAP COMPLIANCE DATA:  Date Range: 01/27/2023 - 05/26/2023  Average Daily Use: 3:03 hours  Median Use: 3:23 hours  Compliance for > 4 Hours: 33 days  AHI: 4.6 respiratory events per hour  Days Used: 109/120  Mask Leak: 7.7  95th Percentile Pressure: 11.4 cmh20         LABS: No results found for this or any previous visit (from the past 2160 hour(s)).  Radiology: DG Ribs Unilateral W/Chest Right  Result Date: 01/21/2021 CLINICAL DATA:  Fall with right rib pain. EXAM: RIGHT RIBS AND CHEST - 3+ VIEW COMPARISON:  None. FINDINGS: No fracture or other bone lesions are seen involving the ribs. Mild degenerative changes are seen in both acromioclavicular joints. There is no evidence of pneumothorax or pleural effusion. Both lungs are clear. Heart size and mediastinal contours are within normal limits. IMPRESSION: No acute osseous injury.  Clear lungs. Electronically Signed   By: Romona Curls M.D.   On: 01/21/2021 11:06    No results found.  No results found.    Assessment and Plan: Patient Active Problem List   Diagnosis Date Noted   OSA (obstructive sleep apnea)  05/31/2023   OSA on CPAP 01/25/2023   CPAP use counseling 01/25/2023   Headache disorder 01/25/2023   Hearing disorder 01/25/2023   Osteoarthritis of knee 10/23/2016    1. OSA (obstructive sleep apnea) The patient does tolerate PAP and reports little  benefit from PAP use. She would like to pursue an oral appliance. She understands the need for an efficacy study. She is planning on using the cpap in the interim. The patient was reminded how to clean equipment and advised to replace supplies routinely. The patient was also counselled on  weight loss. The compliance is poor. The AHI is 4.6.   Mild OSA not feeling benefit from cpap- pursue oral appliance. F/u when acclimated for efficacy study.   2. CPAP use counseling CPAP Counseling: had a lengthy discussion with the patient regarding the importance of PAP therapy in management of the sleep apnea. Patient appears to understand the risk factor reduction and also understands the risks associated with untreated sleep apnea. Patient will try to make a good faith effort to remain compliant with therapy. Also instructed the patient on proper cleaning of the device including the water must be changed daily if possible and use of distilled water is preferred. Patient understands that the machine should be regularly cleaned with appropriate recommended cleaning solutions that do not damage the PAP machine for example given white vinegar and water rinses. Other methods such as ozone treatment may not be as good as these simple methods to achieve cleaning.    General Counseling: I have discussed the findings of the evaluation and examination with Byrd Hesselbach.  I have also discussed any further diagnostic evaluation thatmay be needed or ordered today. Vinnie verbalizes understanding of the findings of todays visit. We also reviewed her medications today and discussed drug interactions and side effects including but not limited excessive drowsiness and altered mental  states. We also discussed that there is always a risk not just to her but also people around her. she has been encouraged to call the office with any questions or concerns that should arise related to todays visit.  No orders of the defined types were placed in this encounter.       I have personally obtained a history, examined the patient, evaluated laboratory and imaging results, formulated the assessment and plan and placed orders. This patient was seen today by Emmaline Kluver, PA-C in collaboration with Dr. Freda Munro.   Yevonne Pax, MD [facility] Diplomate ABMS Pulmonary Critical Care Medicine and Sleep Medicine
